# Patient Record
Sex: Female | Born: 1985 | Race: Black or African American | Hispanic: No | Marital: Married | Smoking: Never smoker
Health system: Southern US, Community
[De-identification: ages and names within clinical notes are randomized; demographics above are authoritative.]

## PROBLEM LIST (undated history)

## (undated) DIAGNOSIS — N946 Dysmenorrhea, unspecified: Secondary | ICD-10-CM

## (undated) HISTORY — DX: Dysmenorrhea, unspecified: N94.6

---

## 2006-08-02 HISTORY — PX: WISDOM TOOTH EXTRACTION: SHX21

## 2020-11-18 ENCOUNTER — Encounter: Payer: Self-pay | Admitting: Gynecologic Oncology

## 2020-11-18 ENCOUNTER — Telehealth: Payer: Self-pay | Admitting: *Deleted

## 2020-11-18 DIAGNOSIS — R971 Elevated cancer antigen 125 [CA 125]: Secondary | ICD-10-CM

## 2020-11-18 DIAGNOSIS — N83201 Unspecified ovarian cyst, right side: Secondary | ICD-10-CM

## 2020-11-18 NOTE — Telephone Encounter (Signed)
Called and left the patient a message to call the office back. Patient needs to be scheduled for a new patient appt  °

## 2020-11-18 NOTE — Telephone Encounter (Signed)
Returned patient's call and scheduled a new patient with Dr Pricilla Holm on 4/22 at 10:30 am. Patient given an arrival time 10 am. Patient given the address and phone number for the clinic; along with the policy for mask and visitors

## 2020-11-20 NOTE — Progress Notes (Signed)
GYNECOLOGIC ONCOLOGY NEW PATIENT CONSULTATION   Patient Name: Tami Young  Patient Age: 35 y.o. Date of Service: 11/21/20 Referring Provider: Dr. Caffie Damme  Primary Care Provider: No primary care provider on file. Consulting Provider: Jeral Pinch, MD   Assessment/Plan:  Premenopausal patient with bilateral complex adnexal masses and mildly elevated CA-125.  I discussed in detail with the patient findings from her recent ultrasound.  Unfortunately, because this was done at an outside clinic, I do not have access to the images.  Additionally, I do not have the report from her ultrasound in the fall of last year, so I have no comparison to previous imaging.  We discussed the nature of ovarian cysts and that she has a cyst on each ovary or within the adnexa but is somewhat complex in nature.  On one side, there is a solid component to the cyst and on the other there findings consistent with a hemorrhagic cyst.  We discussed the difficulty with the use of CA-125 given that it is neither a sensitive nor specific test.  Up to 50% of ovarian cancers that are early stage we will have a normal Ca1 25 and this lab test can be elevated in a number of noncancerous settings, such as endometriosis or uterine fibroids.  In premenopausal patient, while her recent CA125 value was above the upper limits of normal, this is not suggest strongly a malignant process.  Given her history of painful menses as well as a hemorrhagic cyst, endometriosis is on the differential diagnoses both for findings on her ultrasound as well as her mildly elevated CA-125.  To help better assess the need for additional imaging versus surgery, I will have my office reach out to see if we can get the ultrasound report from the fall.  If there is been significant change in size of the previously seen adnexal mass or the features have changed significantly, then I discussed we could proceed with a pelvic MRI to better characterize  these masses.  While she may ultimately benefit from surgery, she strongly desires to retain fertility at this point and given her AMH, performing a unilateral oophorectomy is not without some risk to her.  While she has been trying for a number of years to achieve pregnancy, this is somewhat complicated by the fact that she is not physically been with her husband for much of that time.  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  Was amenable to this and my office will send referrals to a group in [place] as well as to [facility].  In terms of her family history, she has family history that would be suggestive of a hereditary cancer syndrome yet she has had genetic testing within the last year that was negative for any known pathogenic mutation.  We discussed that she is likely still at increased risk compared to baseline population due to her family history and that we just may not have the technology with her testing at this time to identify her increased risk.  I think it would be very reasonable when she has completed childbearing to discuss risk reducing BSO.   I will call the patient once I have received additional records from her GYN to discuss next steps.  A copy of this note was sent to the patient's referring provider.   65 minutes of total time was spent for  this patient encounter, including preparation, face-to-face counseling with the patient and coordination of care, and documentation of the encounter.  Jeral Pinch, MD  Division of Gynecologic Oncology  Department of Obstetrics and Gynecology  [facility]  ___________________________________________  Chief Complaint: Chief Complaint  Patient presents with  . Bilateral ovarian cysts  . Elevated cancer antigen 125 (CA 125)    History of Present Illness:   Tami Young is a 35 y.o. y.o. female who is seen in consultation at the request of Dr. Alwyn Pea for an evaluation of bilateral complex adnexal masses and elevated CA-125.  Patient moved from [place] about a year and a half ago and established GYN care last fall.  On exam, she was noted to have what felt like a possible uterine fibroid and an ultrasound was obtained at that point.  I do not have access to that ultrasound.  She recently had follow-up with her GYN and a pelvic ultrasound performed on 11/06/2020 at [place] showed the following: Anteverted uterus measuring 10.2 cm with normal-appearing endometrium.  Right ovary with cystic structure with solid components, likely complex cyst measuring 2.7 x 2 x 2.5 cm.  Left ovary contains cystic structure with internal echoes, likely hemorrhagic cyst measures 3.2 x 3.2 x 2.1 cm.  Given her family history, recommendation was made to obtain tumor markers.  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 Tumor markers and lab tests were obtained with results as follows: CA-125: 105 CA 19-9: 23 AMH: 0.046 LH: 11.6 FSH: 12.2 TSH: 1.7 Estradiol: 51.1 Beta-hCG: Less than 1  Her family history is notable for breast cancer in her mother at the age of 29.  Her maternal grandmother was diagnosed with ovarian cancer at the age of 78.  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  Her lifetime breast cancer risk score is estimated to be 13.7% she had her first mammogram at the end of last year.  She has discussed with the genetic counselor about having regular screening given her family history.  Patient denies any pelvic pain or cramping.  She has a long history of painful menses and has to take Pamprin or Midol for the first couple of days of her periods.  She intermittently has  nausea related to the pain.  Over the last 3 years, she has had 2 or 3 cycles that came every 2 weeks.  Otherwise, her cycles are about every 28 days.  Denies any abdominal bloating, early satiety, nausea, emesis, or dyspareunia.  She has about a day of low back pain with her last cycle day.  She endorses normal bowel function and denies any pain with bowel movements around the time of her menses.  Her first 3 pregnancies were with a different partner than her husband.  She and her current husband have been married for 5 years and have achieved pregnancy once which ended in a miscarriage.  He is in the [employer] and gone for a significant amount of time.  They have only seen each other for a couple of weeks over the last 18 months.  She has been off birth control since 2017 and had used ovulation predictor kits and calculators as well as urine strips when she and her husband were physically together.  The patient lives in [place] with her husband and 2 children.  Her husband and is in the [employer] and has been gone for almost all the last 18 months since they moved here.  Been married 5 years.  Patient works full-time as a Agricultural engineer.  PAST MEDICAL HISTORY:  Past Medical History:  Diagnosis Date  . Dysmenorrhea      PAST SURGICAL HISTORY:  Past Surgical History:  Procedure Laterality Date  . WISDOM TOOTH EXTRACTION  08/02/2006    OB/GYN HISTORY:  OB History  Gravida Para Term Preterm AB Living  '4 3 2 1 1 2  ' SAB IAB Ectopic Multiple Live Births  1            # Outcome Date GA Lbr Len/2nd Weight Sex Delivery Anes PTL Lv  4 SAB           3 Preterm           2 Term           1 Term             No LMP recorded.  Age at menarche: 49 Age at menopause: N/AA Hx of HRT: N/A Hx of STDs: no Last pap: 05/2020, negative History of abnormal pap smears: No  SCREENING STUDIES:  Last mammogram: 06/2020   MEDICATIONS: Outpatient Encounter Medications as of 11/21/2020  Medication Sig  .  Multiple Vitamin (MULTIVITAMIN) tablet Take 1 tablet by mouth daily.   No facility-administered encounter medications on file as of 11/21/2020.    ALLERGIES:  No Known Allergies   FAMILY HISTORY:  Family History  Problem Relation Age of Onset  . Breast cancer Mother   . Ovarian cancer Maternal Grandmother   . Colon cancer Neg Hx   . Endometrial cancer Neg Hx   . Pancreatic cancer Neg Hx   . Prostate cancer Neg Hx      SOCIAL HISTORY:  Social Connections: Not on file    REVIEW OF SYSTEMS:  Denies appetite changes, fevers, chills, fatigue, unexplained weight changes. Denies hearing loss, neck lumps or masses, mouth sores, ringing in ears or voice changes. Denies cough or wheezing.  Denies shortness of breath. Denies chest pain or palpitations. Denies leg swelling. Denies abdominal distention, pain, blood in stools, constipation, diarrhea, nausea, vomiting, or early satiety. Denies pain with intercourse, dysuria, frequency, hematuria or incontinence. Denies hot flashes, pelvic pain, vaginal bleeding or vaginal discharge.   Denies joint pain, back pain or muscle pain/cramps. Denies itching, rash, or wounds. Denies dizziness, headaches, numbness or seizures. Denies swollen lymph nodes or glands, denies easy bruising or bleeding. Denies anxiety, depression, confusion, or decreased concentration.  Physical Exam:  Vital Signs for this encounter:  Blood pressure 115/63, pulse 96, temperature 98.6 F (37 C), temperature source Tympanic, resp. rate 18, height 5' (1.524 m), weight 155 lb 4.8 oz (70.4 kg), SpO2 100 %. Body mass index is 30.33 kg/m. General: Alert, oriented, no acute distress.  HEENT: Normocephalic, atraumatic. Sclera anicteric.  Chest: Clear to auscultation bilaterally. No wheezes, rhonchi, or rales. Cardiovascular: Regular rate and rhythm, no murmurs, rubs, or gallops.  Abdomen: Normoactive bowel sounds. Soft, nondistended, nontender to palpation. No masses or  hepatosplenomegaly appreciated. No palpable fluid wave.  Extremities: Grossly normal range of motion. Warm, well perfused. No edema bilaterally.  Skin: No rashes or lesions.  Lymphatics: No cervical, supraclavicular, or inguinal adenopathy.  GU:  Normal external female genitalia. No lesions. No discharge or bleeding.             Bladder/urethra:  No lesions or masses,  well supported bladder             Vagina: Well rugated vaginal mucosa, no lesions or masses.             Cervix: Normal appearing, no lesions.             Uterus: 8-10 cm, mobile, no parametrial involvement or nodularity.             Adnexa: Fullness appreciated in the right anterior cul-de-sac, mobile.  No other masses appreciated.  LABORATORY AND RADIOLOGIC DATA:  Outside medical records were reviewed to synthesize the above history, along with the history and physical obtained during the visit.   No results found for: WBC, HGB, HCT, PLT, GLUCOSE, CHOL, TRIG, HDL, LDLDIRECT, LDLCALC, ALT, AST, NA, K, CL, CREATININE, BUN, CO2, TSH, PSA, INR, GLUF, HGBA1C, MICROALBUR

## 2020-11-21 ENCOUNTER — Encounter: Payer: Self-pay | Admitting: Gynecologic Oncology

## 2020-11-21 ENCOUNTER — Other Ambulatory Visit: Payer: Self-pay

## 2020-11-21 ENCOUNTER — Inpatient Hospital Stay: Payer: Self-pay | Attending: Gynecologic Oncology | Admitting: Gynecologic Oncology

## 2020-11-21 VITALS — BP 115/63 | HR 96 | Temp 98.6°F | Resp 18 | Ht 60.0 in | Wt 155.3 lb

## 2020-11-21 DIAGNOSIS — N83201 Unspecified ovarian cyst, right side: Secondary | ICD-10-CM | POA: Insufficient documentation

## 2020-11-21 DIAGNOSIS — Z803 Family history of malignant neoplasm of breast: Secondary | ICD-10-CM | POA: Insufficient documentation

## 2020-11-21 DIAGNOSIS — N83202 Unspecified ovarian cyst, left side: Secondary | ICD-10-CM | POA: Insufficient documentation

## 2020-11-21 DIAGNOSIS — Z8041 Family history of malignant neoplasm of ovary: Secondary | ICD-10-CM | POA: Insufficient documentation

## 2020-11-21 DIAGNOSIS — R971 Elevated cancer antigen 125 [CA 125]: Secondary | ICD-10-CM | POA: Insufficient documentation

## 2020-11-21 DIAGNOSIS — R110 Nausea: Secondary | ICD-10-CM | POA: Insufficient documentation

## 2020-11-21 NOTE — Patient Instructions (Addendum)
It was a pleasure meeting you today.  I will have my office work on getting your ultrasound from last fall from Dr. Mora Appl.  Once I have this, I will call you to discuss whether I think we should move forward with a pelvic MRI or whether we will plan to repeat a pelvic ultrasound in 3 months.  In the meantime, I am going to send a referral to a group in town and to [facility] to to see if we can get you scheduled to see one of the reproductive endocrinologist and infertility specialist.  I think even having a virtual visit over the next month or 2 would be helpful for you in terms of planning.

## 2020-11-27 ENCOUNTER — Telehealth: Payer: Self-pay | Admitting: Gynecologic Oncology

## 2020-11-27 NOTE — Telephone Encounter (Signed)
Called patient to discuss ultrasound findings from 07/01/20 (received copy of reported from outside office) - she did not answer my call.  Overall, my concern that this is a malignancy or precancerous lesion is low. Tami Young and I discussed at her visit surveillance with a repeat ultrasound in 3 months versus pelvic MRI now. I would favor the former option. I left a message with basic details and callback number.  Eugene Garnet MD Gynecologic Oncology

## 2020-12-02 ENCOUNTER — Telehealth: Payer: Self-pay | Admitting: Gynecologic Oncology

## 2020-12-02 DIAGNOSIS — N83201 Unspecified ovarian cyst, right side: Secondary | ICD-10-CM

## 2020-12-02 DIAGNOSIS — R971 Elevated cancer antigen 125 [CA 125]: Secondary | ICD-10-CM

## 2020-12-02 DIAGNOSIS — N83202 Unspecified ovarian cyst, left side: Secondary | ICD-10-CM

## 2020-12-02 NOTE — Telephone Encounter (Signed)
I reached out to the patient again regarding ultrasound that really received from her GYNs office.  I discussed the findings and offered either close repeat surveillance with a pelvic ultrasound versus pelvic MRI now.  Patient's preference is to proceed with pelvic MRI at this time.  She has heard from the [facility] clinic here in [place] and is scheduled for new patient appointment with them in September.  I encouraged her to reach out to both their clinic as well as to [facility] to see if there is any chance she could get in even for a virtual visit before then.  Eugene Garnet MD Gynecologic Oncology

## 2020-12-05 ENCOUNTER — Telehealth: Payer: Self-pay | Admitting: *Deleted

## 2020-12-05 NOTE — Telephone Encounter (Signed)
Per Dr Pricilla Holm called and scheduled MRI for 5/17 at [facility]. Patient aware of date/time of appt

## 2020-12-09 ENCOUNTER — Telehealth: Payer: Self-pay | Admitting: *Deleted

## 2020-12-09 NOTE — Telephone Encounter (Signed)
Called and spoke with the patient, gave the phone number for [facility] fertility. Explained that the office from [facility] is trying to reach her

## 2020-12-16 ENCOUNTER — Ambulatory Visit (HOSPITAL_COMMUNITY)
Admission: RE | Admit: 2020-12-16 | Discharge: 2020-12-16 | Disposition: A | Discharge status: Home / Self Care | Source: Ambulatory Visit | Attending: Gynecologic Oncology | Admitting: Gynecologic Oncology

## 2020-12-16 ENCOUNTER — Other Ambulatory Visit: Payer: Self-pay

## 2020-12-16 DIAGNOSIS — N83202 Unspecified ovarian cyst, left side: Secondary | ICD-10-CM | POA: Diagnosis present

## 2020-12-16 DIAGNOSIS — R971 Elevated cancer antigen 125 [CA 125]: Secondary | ICD-10-CM | POA: Insufficient documentation

## 2020-12-16 DIAGNOSIS — N83201 Unspecified ovarian cyst, right side: Secondary | ICD-10-CM | POA: Diagnosis not present

## 2020-12-16 MED ORDER — GADOBUTROL 1 MMOL/ML IV SOLN
7.0000 mL | Freq: Once | INTRAVENOUS | Status: AC | PRN
  Administered 2020-12-16: 7 mL via INTRAVENOUS

## 2020-12-17 ENCOUNTER — Telehealth: Payer: Self-pay | Admitting: Gynecologic Oncology

## 2020-12-17 NOTE — Telephone Encounter (Signed)
I called the patient to discuss MRI findings, most consistent with bilateral endometriomas.  No answer, left voicemail with callback number.  Eugene Garnet MD Gynecologic Oncology

## 2020-12-18 ENCOUNTER — Telehealth: Payer: Self-pay | Admitting: *Deleted

## 2020-12-18 NOTE — Telephone Encounter (Signed)
Patient has a virtual appt with [facility] on 7/13

## 2021-02-27 ENCOUNTER — Telehealth: Payer: Self-pay

## 2021-02-27 NOTE — Telephone Encounter (Signed)
Called to follow up with Tami Young in regards to her virtual visit on 7/13 with [facility]. It does not appear this visit took place. Requested a return call.

## 2023-01-06 IMAGING — MR MR PELVIS WO/W CM
20 series · 48 of 48 positions shown · IV contrast (7ml GADAVIST)
Comparison: None.

CLINICAL DATA: Adnexal mass

EXAM:
MRI PELVIS WITHOUT AND WITH CONTRAST
TECHNIQUE: Multiplanar multisequence MR imaging of the pelvis was performed
both before and after administration of intravenous contrast.
CONTRAST:  7mL GADAVIST GADOBUTROL 1 MMOL/ML IV SOLN

[Series 2: T2 · coronal · 6.0mm · 1.56mm/px · 1 of 30 slices shown (1 of 4)]
[im 1/30]
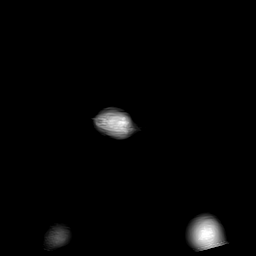

[Series 3: T2 · axial · 5.0mm · 0.51mm/px · 1 of 34 slices shown (2 of 4)]
[im 1/34]
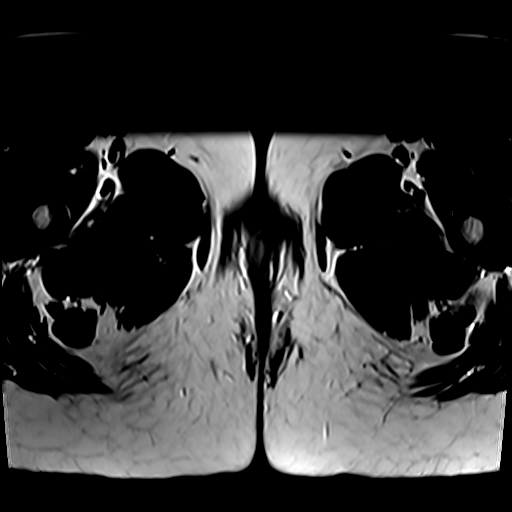

[Series 4: T2 fat-sat · axial · 5.0mm · 0.51mm/px · 1 of 34 slices shown]
[im 1/34]
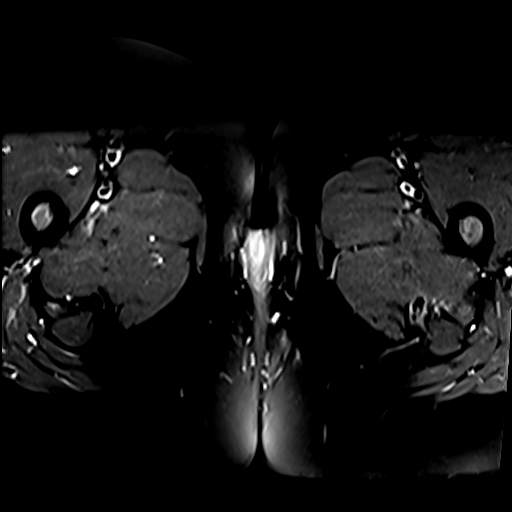

[Series 5: T2 · coronal · 4.0mm · 0.51mm/px · 1 of 36 slices shown (3 of 4)]
[im 1/36]
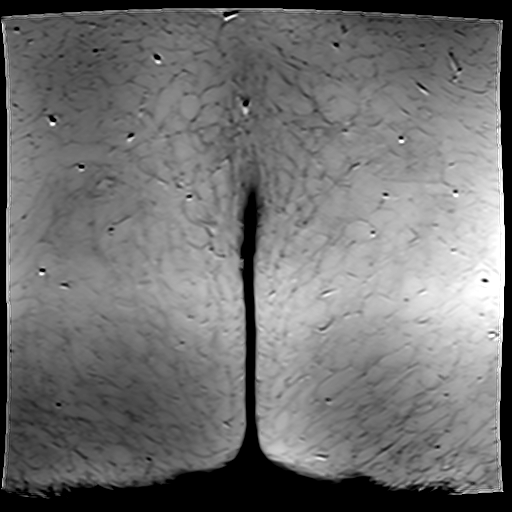

[Series 6: T2 · sagittal · 5.0mm · 0.55mm/px · 1 of 38 slices shown (4 of 4)]
[im 1/38]
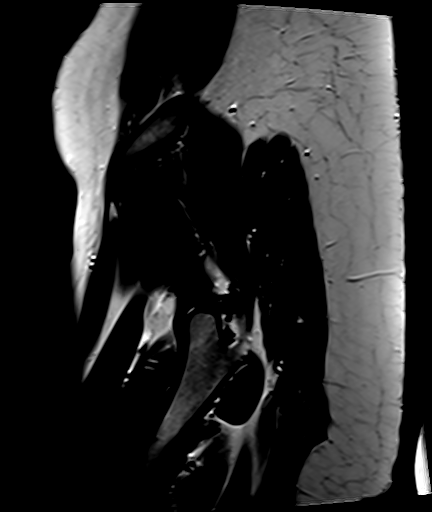

[Series 7: T1 · axial · 4.0mm · 0.84mm/px · z∈[-188,+96]mm · 3 of 72 slices shown (1 of 2)]
[im 1/72]
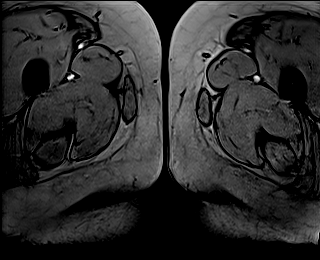
[im 36/72]
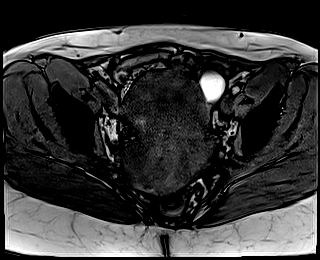
[im 72/72]
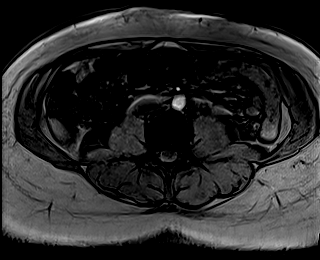

[Series 8: T1 · axial · 4.0mm · 0.84mm/px · z∈[-188,+96]mm · 3 of 72 slices shown (2 of 2)]
[im 1/72]
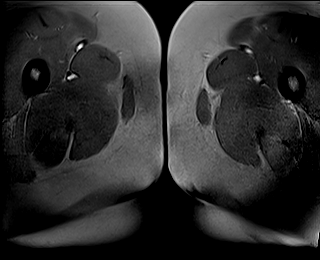
[im 36/72]
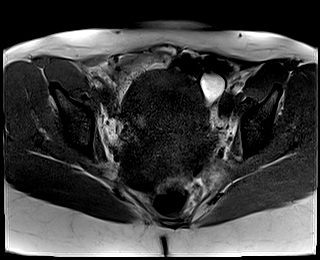
[im 72/72]
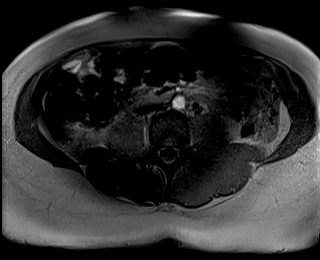

[Series 9: DWI · axial · 5.0mm · 2.80mm/px · z∈[-172,+28]mm · 5 of 122 slices shown (1 of 3)]
[im 1/122]
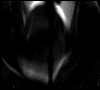
[im 31/122]
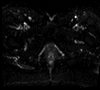
[im 61/122]
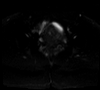
[im 91/122]
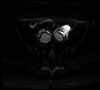
[im 122/122]
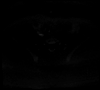

[Series 10: DWI · axial · 5.0mm · 2.80mm/px · z∈[-172,+28]mm · 2 of 41 slices shown (2 of 3)]
[im 1/41]
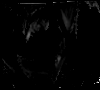
[im 41/41]
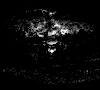

[Series 11: DWI · axial · 5.0mm · 2.80mm/px · z∈[-172,+28]mm · 2 of 41 slices shown (3 of 3)]
[im 1/41]
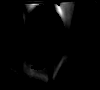
[im 41/41]
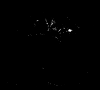

[Series 13: T1 dynamic · axial · 3.0mm · 0.84mm/px · z∈[-178,+59]mm · 3 of 80 slices shown (1 of 7)]
[im 1/80]
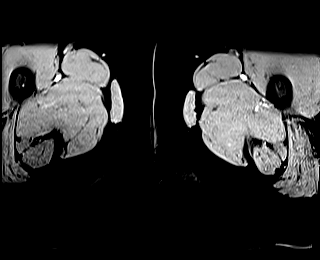
[im 40/80]
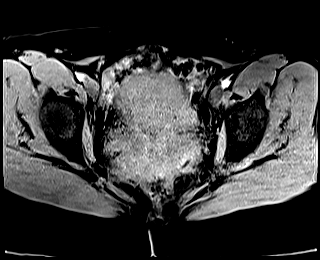
[im 80/80]
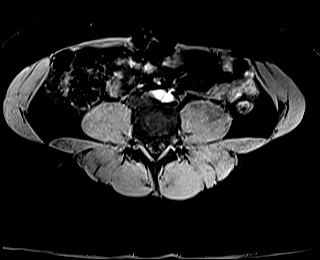

[Series 17: T1 dynamic · axial · 3.0mm · 0.84mm/px · z∈[-178,+59]mm · 3 of 80 slices shown (2 of 7)]
[im 1/80]
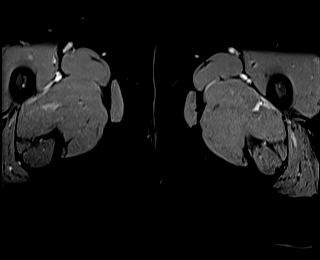
[im 40/80]
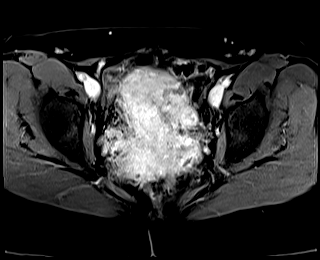
[im 80/80]
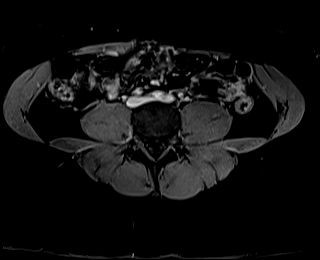

[Series 18: T1 dynamic · axial · 3.0mm · 0.84mm/px · z∈[-178,+59]mm · 3 of 80 slices shown (3 of 7)]
[im 1/80]
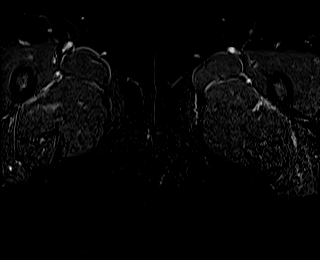
[im 40/80]
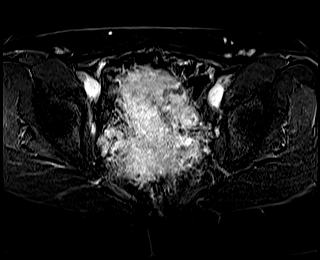
[im 80/80]
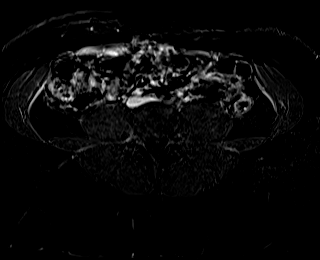

[Series 21: T1 dynamic · axial · 3.0mm · 0.84mm/px · z∈[-178,+59]mm · 3 of 80 slices shown (4 of 7)]
[im 1/80]
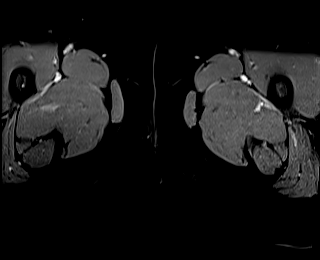
[im 40/80]
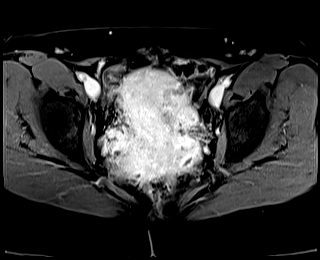
[im 80/80]
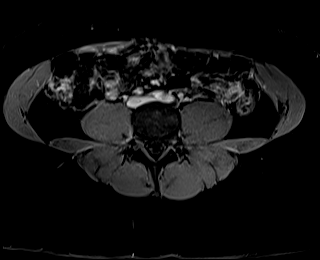

[Series 22: T1 dynamic · axial · 3.0mm · 0.84mm/px · z∈[-178,+59]mm · 3 of 80 slices shown (5 of 7)]
[im 1/80]
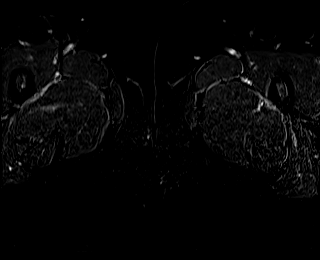
[im 40/80]
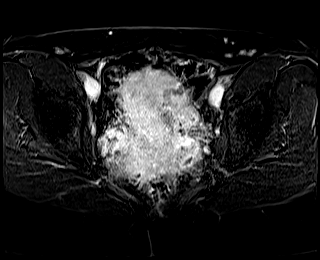
[im 80/80]
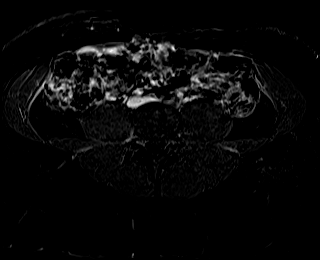

[Series 25: T1 dynamic · axial · 3.0mm · 0.84mm/px · z∈[-178,+59]mm · 3 of 80 slices shown (6 of 7)]
[im 1/80]
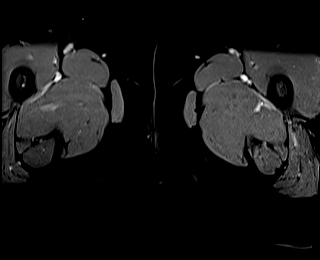
[im 40/80]
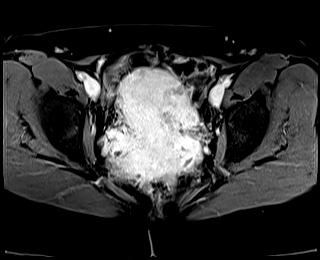
[im 80/80]
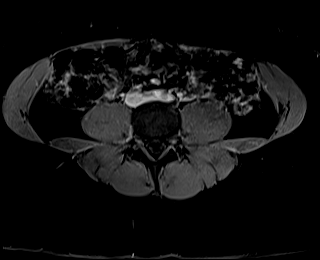

[Series 26: T1 dynamic · axial · 3.0mm · 0.84mm/px · z∈[-178,+59]mm · 3 of 80 slices shown (7 of 7)]
[im 1/80]
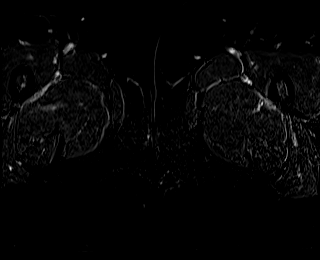
[im 40/80]
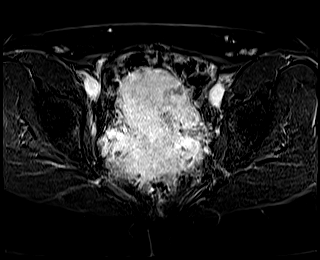
[im 80/80]
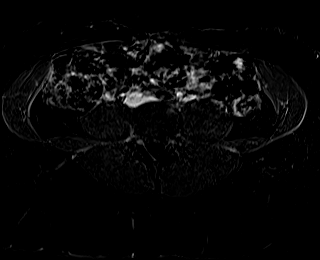

[Series 28: T1 dynamic post-contrast · axial · 3.0mm · 1.06mm/px · z∈[-165,+72]mm · 3 of 80 slices shown (1 of 2)]
[im 1/80]
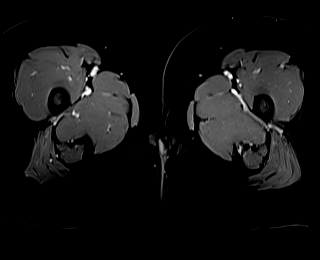
[im 40/80]
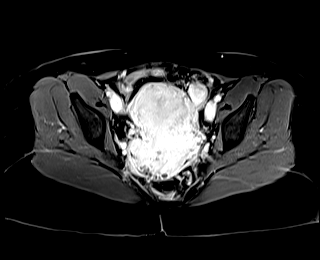
[im 80/80]
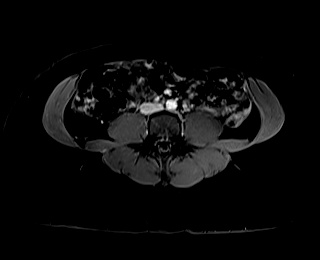

[Series 29: T2 post-contrast · sagittal · 5.0mm · 0.55mm/px · 1 of 19 slices shown]
[im 1/19]
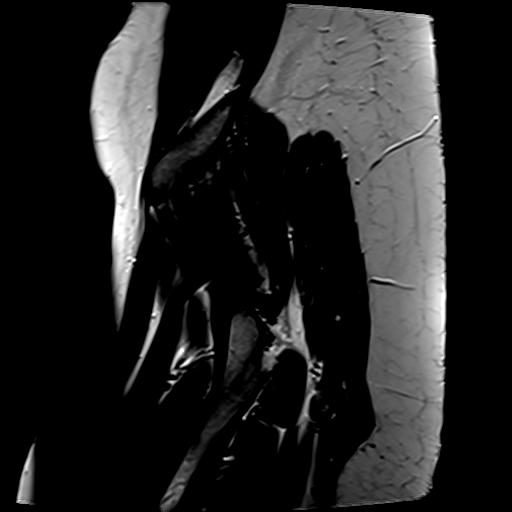

[Series 31: T1 dynamic post-contrast · sagittal · 3.0mm · 0.88mm/px · 3 of 80 slices shown (2 of 2)]
[im 1/80]
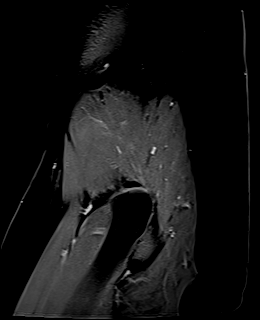
[im 40/80]
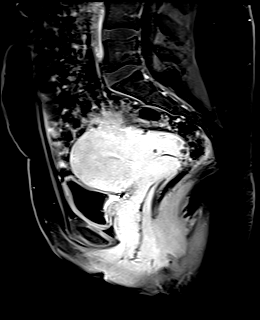
[im 80/80]
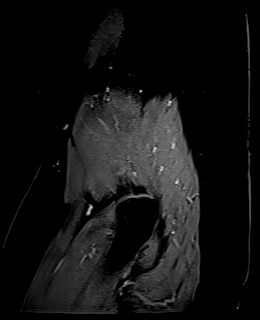

[48 of 48 positions shown; findings below may reference images not displayed]

FINDINGS: Urinary Tract:  No abnormality visualized.

Bowel:  Unremarkable visualized pelvic bowel loops.

Vascular/Lymphatic: No pathologically enlarged lymph nodes. No
significant vascular abnormality seen.

Reproductive: Uterus measures 10.5 x 5.6 x 6.6 cm. Junctional zone
not well preserved on T2 imaging with focal adenomyoma versus
submucosal fibroid anterior uterine body measuring 2.9 cm.

4.2 x 5.6 x 4.3 cm complex multicystic lesion identified in the left
adnexal space with cystic components of varying signal intensity.
Generally, the dominant cystic components have high signal intensity
on fat suppressed T1 imaging and show T2 shading on T2 axial
imaging. No enhancement of the cystic components after IV contrast
administration. This lesion appears to involve the left ovary.

4.2 x 5.5 x 4.1 cm similar multicystic lesion identified in the
right adnexal space. This lesion also has dominant foci of high
signal intensity on fat suppressed T1 imaging that show shading on
T2 axial images. Dominant areas of cystic change show no associated
enhancement after IV contrast administration. This lesion appears to
involve the right ovary.

Other:  No substantial intraperitoneal free fluid.

Musculoskeletal: No focal suspicious marrow enhancement within the
visualized bony anatomy.
IMPRESSION: 1. 5.6 x 5.6 x 4.3 cm complex multicystic lesion in the left adnexal
space with similar 4.2 x 5.5 x 4.1 cm similar multicystic lesion
identified in the right adnexal space. Imaging features are most
suggestive of complex bilateral adnexal endometriomas with ovarian
involvement bilaterally. There is associated enhancing soft tissue
in the adnexal regions bilaterally, but none of the cystic regions
show papillary nodularity or internal architecture.
2. 2.9 cm focal adenomyoma versus submucosal fibroid anterior
uterine body.
